# Patient Record
Sex: Male | Born: 2000 | Hispanic: No | Marital: Single | Smoking: Never smoker
Health system: Southern US, Community
[De-identification: ages and names within clinical notes are randomized; demographics above are authoritative.]

## PROBLEM LIST (undated history)

## (undated) DIAGNOSIS — E669 Obesity, unspecified: Secondary | ICD-10-CM

## (undated) DIAGNOSIS — J45998 Other asthma: Secondary | ICD-10-CM

## (undated) DIAGNOSIS — J302 Other seasonal allergic rhinitis: Secondary | ICD-10-CM

## (undated) DIAGNOSIS — E7800 Pure hypercholesterolemia, unspecified: Secondary | ICD-10-CM

## (undated) HISTORY — DX: Other seasonal allergic rhinitis: J30.2

## (undated) HISTORY — DX: Pure hypercholesterolemia, unspecified: E78.00

## (undated) HISTORY — DX: Other asthma: J45.998

## (undated) HISTORY — PX: NO PAST SURGERIES: SHX2092

## (undated) HISTORY — DX: Obesity, unspecified: E66.9

---
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
## 2020-05-07 ENCOUNTER — Ambulatory Visit (HOSPITAL_COMMUNITY): Payer: Self-pay
